# Patient Record
Sex: Female | Born: 1955 | Race: White | Hispanic: No
Health system: Southern US, Community
[De-identification: ages and names within clinical notes are randomized; demographics above are authoritative.]

---

## 2010-01-21 ENCOUNTER — Emergency Department (HOSPITAL_COMMUNITY): Admission: EM | Admit: 2010-01-21 | Discharge: 2010-01-22 | Payer: Self-pay | Admitting: Emergency Medicine

## 2010-05-31 LAB — ABO/RH: ABO/RH(D): A POS

## 2010-05-31 LAB — CBC
HCT: 37.1 % (ref 36.0–46.0)
Hemoglobin: 11.6 g/dL — ABNORMAL LOW (ref 12.0–15.0)
MCH: 27.3 pg (ref 26.0–34.0)
MCHC: 31.3 g/dL (ref 30.0–36.0)
MCV: 87.3 fL (ref 78.0–100.0)
Platelets: 305 10*3/uL (ref 150–400)
RBC: 4.25 MIL/uL (ref 3.87–5.11)
RDW: 14.7 % (ref 11.5–15.5)
WBC: 13.9 10*3/uL — ABNORMAL HIGH (ref 4.0–10.5)

## 2010-05-31 LAB — COMPREHENSIVE METABOLIC PANEL
Albumin: 3.3 g/dL — ABNORMAL LOW (ref 3.5–5.2)
Alkaline Phosphatase: 60 U/L (ref 39–117)
BUN: 6 mg/dL (ref 6–23)
Creatinine, Ser: 0.78 mg/dL (ref 0.4–1.2)
Glucose, Bld: 136 mg/dL — ABNORMAL HIGH (ref 70–99)
Potassium: 4 mEq/L (ref 3.5–5.1)
Total Protein: 6.6 g/dL (ref 6.0–8.3)

## 2010-05-31 LAB — ETHANOL: Alcohol, Ethyl (B): <5 mg/dL (ref 0–10)

## 2010-05-31 LAB — TYPE AND SCREEN: ABO/RH(D): A POS
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
## 2012-07-29 DIAGNOSIS — E538 Deficiency of other specified B group vitamins: Secondary | ICD-10-CM | POA: Diagnosis not present

## 2012-07-29 DIAGNOSIS — R1011 Right upper quadrant pain: Secondary | ICD-10-CM | POA: Diagnosis not present

## 2012-07-29 DIAGNOSIS — E7800 Pure hypercholesterolemia, unspecified: Secondary | ICD-10-CM | POA: Diagnosis not present

## 2012-07-30 DIAGNOSIS — E7800 Pure hypercholesterolemia, unspecified: Secondary | ICD-10-CM | POA: Diagnosis not present

## 2012-08-01 DIAGNOSIS — K8020 Calculus of gallbladder without cholecystitis without obstruction: Secondary | ICD-10-CM | POA: Diagnosis not present

## 2012-08-01 DIAGNOSIS — R1011 Right upper quadrant pain: Secondary | ICD-10-CM | POA: Diagnosis not present

## 2012-08-08 DIAGNOSIS — K8020 Calculus of gallbladder without cholecystitis without obstruction: Secondary | ICD-10-CM | POA: Diagnosis not present

## 2012-08-14 DIAGNOSIS — Z801 Family history of malignant neoplasm of trachea, bronchus and lung: Secondary | ICD-10-CM | POA: Diagnosis not present

## 2012-08-14 DIAGNOSIS — Z823 Family history of stroke: Secondary | ICD-10-CM | POA: Diagnosis not present

## 2012-08-14 DIAGNOSIS — Z880 Allergy status to penicillin: Secondary | ICD-10-CM | POA: Diagnosis not present

## 2012-08-14 DIAGNOSIS — Z79899 Other long term (current) drug therapy: Secondary | ICD-10-CM | POA: Diagnosis not present

## 2012-08-14 DIAGNOSIS — K8010 Calculus of gallbladder with chronic cholecystitis without obstruction: Secondary | ICD-10-CM | POA: Diagnosis not present

## 2012-08-14 DIAGNOSIS — Z8261 Family history of arthritis: Secondary | ICD-10-CM | POA: Diagnosis not present

## 2012-08-14 DIAGNOSIS — E538 Deficiency of other specified B group vitamins: Secondary | ICD-10-CM | POA: Diagnosis not present

## 2012-08-14 DIAGNOSIS — E7800 Pure hypercholesterolemia, unspecified: Secondary | ICD-10-CM | POA: Diagnosis not present

## 2012-08-14 DIAGNOSIS — Z8249 Family history of ischemic heart disease and other diseases of the circulatory system: Secondary | ICD-10-CM | POA: Diagnosis not present

## 2012-08-16 DIAGNOSIS — Z79899 Other long term (current) drug therapy: Secondary | ICD-10-CM | POA: Diagnosis not present

## 2012-08-16 DIAGNOSIS — Z823 Family history of stroke: Secondary | ICD-10-CM | POA: Diagnosis not present

## 2012-08-16 DIAGNOSIS — Z801 Family history of malignant neoplasm of trachea, bronchus and lung: Secondary | ICD-10-CM | POA: Diagnosis not present

## 2012-08-16 DIAGNOSIS — E538 Deficiency of other specified B group vitamins: Secondary | ICD-10-CM | POA: Diagnosis not present

## 2012-08-16 DIAGNOSIS — K8066 Calculus of gallbladder and bile duct with acute and chronic cholecystitis without obstruction: Secondary | ICD-10-CM | POA: Diagnosis not present

## 2012-08-16 DIAGNOSIS — Z8261 Family history of arthritis: Secondary | ICD-10-CM | POA: Diagnosis not present

## 2012-08-16 DIAGNOSIS — K8020 Calculus of gallbladder without cholecystitis without obstruction: Secondary | ICD-10-CM | POA: Diagnosis not present

## 2012-08-16 DIAGNOSIS — Z8249 Family history of ischemic heart disease and other diseases of the circulatory system: Secondary | ICD-10-CM | POA: Diagnosis not present

## 2012-08-16 DIAGNOSIS — K8010 Calculus of gallbladder with chronic cholecystitis without obstruction: Secondary | ICD-10-CM | POA: Diagnosis not present

## 2012-08-16 DIAGNOSIS — E7800 Pure hypercholesterolemia, unspecified: Secondary | ICD-10-CM | POA: Diagnosis not present

## 2012-08-29 DIAGNOSIS — E538 Deficiency of other specified B group vitamins: Secondary | ICD-10-CM | POA: Diagnosis not present

## 2012-10-07 DIAGNOSIS — E538 Deficiency of other specified B group vitamins: Secondary | ICD-10-CM | POA: Diagnosis not present

## 2012-11-14 DIAGNOSIS — E538 Deficiency of other specified B group vitamins: Secondary | ICD-10-CM | POA: Diagnosis not present

## 2012-12-17 DIAGNOSIS — E538 Deficiency of other specified B group vitamins: Secondary | ICD-10-CM | POA: Diagnosis not present

## 2013-01-14 DIAGNOSIS — E538 Deficiency of other specified B group vitamins: Secondary | ICD-10-CM | POA: Diagnosis not present

## 2013-01-27 DIAGNOSIS — F321 Major depressive disorder, single episode, moderate: Secondary | ICD-10-CM | POA: Diagnosis not present

## 2013-01-27 DIAGNOSIS — M67919 Unspecified disorder of synovium and tendon, unspecified shoulder: Secondary | ICD-10-CM | POA: Diagnosis not present

## 2013-01-27 DIAGNOSIS — F411 Generalized anxiety disorder: Secondary | ICD-10-CM | POA: Diagnosis not present

## 2013-01-28 DIAGNOSIS — E669 Obesity, unspecified: Secondary | ICD-10-CM | POA: Diagnosis not present

## 2013-01-28 DIAGNOSIS — E7800 Pure hypercholesterolemia, unspecified: Secondary | ICD-10-CM | POA: Diagnosis not present

## 2013-03-18 DIAGNOSIS — E538 Deficiency of other specified B group vitamins: Secondary | ICD-10-CM | POA: Diagnosis not present

## 2013-04-14 DIAGNOSIS — D510 Vitamin B12 deficiency anemia due to intrinsic factor deficiency: Secondary | ICD-10-CM | POA: Diagnosis not present

## 2013-04-14 DIAGNOSIS — M79609 Pain in unspecified limb: Secondary | ICD-10-CM | POA: Diagnosis not present

## 2013-04-14 DIAGNOSIS — E7800 Pure hypercholesterolemia, unspecified: Secondary | ICD-10-CM | POA: Diagnosis not present

## 2013-04-14 DIAGNOSIS — E538 Deficiency of other specified B group vitamins: Secondary | ICD-10-CM | POA: Diagnosis not present

## 2013-04-14 DIAGNOSIS — IMO0002 Reserved for concepts with insufficient information to code with codable children: Secondary | ICD-10-CM | POA: Diagnosis not present

## 2013-05-19 DIAGNOSIS — E538 Deficiency of other specified B group vitamins: Secondary | ICD-10-CM | POA: Diagnosis not present

## 2013-07-07 DIAGNOSIS — E538 Deficiency of other specified B group vitamins: Secondary | ICD-10-CM | POA: Diagnosis not present

## 2013-08-05 DIAGNOSIS — E538 Deficiency of other specified B group vitamins: Secondary | ICD-10-CM | POA: Diagnosis not present

## 2013-09-24 DIAGNOSIS — M1990 Unspecified osteoarthritis, unspecified site: Secondary | ICD-10-CM | POA: Diagnosis not present

## 2013-09-24 DIAGNOSIS — F411 Generalized anxiety disorder: Secondary | ICD-10-CM | POA: Diagnosis not present

## 2013-09-24 DIAGNOSIS — D510 Vitamin B12 deficiency anemia due to intrinsic factor deficiency: Secondary | ICD-10-CM | POA: Diagnosis not present

## 2013-09-24 DIAGNOSIS — E669 Obesity, unspecified: Secondary | ICD-10-CM | POA: Diagnosis not present

## 2013-09-24 DIAGNOSIS — E7800 Pure hypercholesterolemia, unspecified: Secondary | ICD-10-CM | POA: Diagnosis not present

## 2013-09-24 DIAGNOSIS — E538 Deficiency of other specified B group vitamins: Secondary | ICD-10-CM | POA: Diagnosis not present

## 2013-09-30 DIAGNOSIS — E7800 Pure hypercholesterolemia, unspecified: Secondary | ICD-10-CM | POA: Diagnosis not present

## 2013-09-30 DIAGNOSIS — M79609 Pain in unspecified limb: Secondary | ICD-10-CM | POA: Diagnosis not present

## 2013-09-30 DIAGNOSIS — E538 Deficiency of other specified B group vitamins: Secondary | ICD-10-CM | POA: Diagnosis not present

## 2013-09-30 DIAGNOSIS — D510 Vitamin B12 deficiency anemia due to intrinsic factor deficiency: Secondary | ICD-10-CM | POA: Diagnosis not present

## 2013-11-06 DIAGNOSIS — E538 Deficiency of other specified B group vitamins: Secondary | ICD-10-CM | POA: Diagnosis not present

## 2013-12-16 DIAGNOSIS — E538 Deficiency of other specified B group vitamins: Secondary | ICD-10-CM | POA: Diagnosis not present

## 2014-02-06 DIAGNOSIS — E539 Vitamin B deficiency, unspecified: Secondary | ICD-10-CM | POA: Diagnosis not present

## 2014-03-10 DIAGNOSIS — E539 Vitamin B deficiency, unspecified: Secondary | ICD-10-CM | POA: Diagnosis not present

## 2014-03-24 DIAGNOSIS — E780 Pure hypercholesterolemia: Secondary | ICD-10-CM | POA: Diagnosis not present

## 2014-03-24 DIAGNOSIS — E669 Obesity, unspecified: Secondary | ICD-10-CM | POA: Diagnosis not present

## 2014-03-24 DIAGNOSIS — E539 Vitamin B deficiency, unspecified: Secondary | ICD-10-CM | POA: Diagnosis not present

## 2014-03-24 DIAGNOSIS — D510 Vitamin B12 deficiency anemia due to intrinsic factor deficiency: Secondary | ICD-10-CM | POA: Diagnosis not present

## 2014-04-29 DIAGNOSIS — Z1389 Encounter for screening for other disorder: Secondary | ICD-10-CM | POA: Diagnosis not present

## 2014-04-29 DIAGNOSIS — E669 Obesity, unspecified: Secondary | ICD-10-CM | POA: Diagnosis not present

## 2014-04-29 DIAGNOSIS — S92001S Unspecified fracture of right calcaneus, sequela: Secondary | ICD-10-CM | POA: Diagnosis not present

## 2014-04-29 DIAGNOSIS — F411 Generalized anxiety disorder: Secondary | ICD-10-CM | POA: Diagnosis not present

## 2014-04-29 DIAGNOSIS — E782 Mixed hyperlipidemia: Secondary | ICD-10-CM | POA: Diagnosis not present

## 2014-04-29 DIAGNOSIS — E539 Vitamin B deficiency, unspecified: Secondary | ICD-10-CM | POA: Diagnosis not present

## 2014-04-29 DIAGNOSIS — F324 Major depressive disorder, single episode, in partial remission: Secondary | ICD-10-CM | POA: Diagnosis not present

## 2014-06-03 DIAGNOSIS — E539 Vitamin B deficiency, unspecified: Secondary | ICD-10-CM | POA: Diagnosis not present

## 2014-07-07 DIAGNOSIS — E539 Vitamin B deficiency, unspecified: Secondary | ICD-10-CM | POA: Diagnosis not present

## 2014-07-31 DIAGNOSIS — E539 Vitamin B deficiency, unspecified: Secondary | ICD-10-CM | POA: Diagnosis not present

## 2014-09-10 DIAGNOSIS — E539 Vitamin B deficiency, unspecified: Secondary | ICD-10-CM | POA: Diagnosis not present

## 2014-09-10 DIAGNOSIS — E782 Mixed hyperlipidemia: Secondary | ICD-10-CM | POA: Diagnosis not present

## 2014-09-10 DIAGNOSIS — D519 Vitamin B12 deficiency anemia, unspecified: Secondary | ICD-10-CM | POA: Diagnosis not present

## 2014-09-16 DIAGNOSIS — F324 Major depressive disorder, single episode, in partial remission: Secondary | ICD-10-CM | POA: Diagnosis not present

## 2014-09-16 DIAGNOSIS — F411 Generalized anxiety disorder: Secondary | ICD-10-CM | POA: Diagnosis not present

## 2014-09-16 DIAGNOSIS — E539 Vitamin B deficiency, unspecified: Secondary | ICD-10-CM | POA: Diagnosis not present

## 2014-09-16 DIAGNOSIS — E782 Mixed hyperlipidemia: Secondary | ICD-10-CM | POA: Diagnosis not present

## 2014-09-16 DIAGNOSIS — E669 Obesity, unspecified: Secondary | ICD-10-CM | POA: Diagnosis not present

## 2014-09-16 DIAGNOSIS — S92001S Unspecified fracture of right calcaneus, sequela: Secondary | ICD-10-CM | POA: Diagnosis not present

## 2014-10-14 DIAGNOSIS — D510 Vitamin B12 deficiency anemia due to intrinsic factor deficiency: Secondary | ICD-10-CM | POA: Diagnosis not present

## 2014-11-12 DIAGNOSIS — E539 Vitamin B deficiency, unspecified: Secondary | ICD-10-CM | POA: Diagnosis not present

## 2014-12-15 DIAGNOSIS — E539 Vitamin B deficiency, unspecified: Secondary | ICD-10-CM | POA: Diagnosis not present

## 2015-01-15 DIAGNOSIS — E539 Vitamin B deficiency, unspecified: Secondary | ICD-10-CM | POA: Diagnosis not present

## 2015-02-18 DIAGNOSIS — E539 Vitamin B deficiency, unspecified: Secondary | ICD-10-CM | POA: Diagnosis not present

## 2015-03-09 DIAGNOSIS — D510 Vitamin B12 deficiency anemia due to intrinsic factor deficiency: Secondary | ICD-10-CM | POA: Diagnosis not present

## 2015-03-09 DIAGNOSIS — E539 Vitamin B deficiency, unspecified: Secondary | ICD-10-CM | POA: Diagnosis not present

## 2015-03-09 DIAGNOSIS — Z13220 Encounter for screening for lipoid disorders: Secondary | ICD-10-CM | POA: Diagnosis not present

## 2015-03-09 DIAGNOSIS — E782 Mixed hyperlipidemia: Secondary | ICD-10-CM | POA: Diagnosis not present

## 2015-03-16 DIAGNOSIS — E539 Vitamin B deficiency, unspecified: Secondary | ICD-10-CM | POA: Diagnosis not present

## 2015-03-16 DIAGNOSIS — F411 Generalized anxiety disorder: Secondary | ICD-10-CM | POA: Diagnosis not present

## 2015-03-16 DIAGNOSIS — E669 Obesity, unspecified: Secondary | ICD-10-CM | POA: Diagnosis not present

## 2015-03-16 DIAGNOSIS — F324 Major depressive disorder, single episode, in partial remission: Secondary | ICD-10-CM | POA: Diagnosis not present

## 2015-03-16 DIAGNOSIS — Z23 Encounter for immunization: Secondary | ICD-10-CM | POA: Diagnosis not present

## 2015-03-16 DIAGNOSIS — S92001S Unspecified fracture of right calcaneus, sequela: Secondary | ICD-10-CM | POA: Diagnosis not present

## 2015-03-16 DIAGNOSIS — E782 Mixed hyperlipidemia: Secondary | ICD-10-CM | POA: Diagnosis not present

## 2015-04-27 DIAGNOSIS — D510 Vitamin B12 deficiency anemia due to intrinsic factor deficiency: Secondary | ICD-10-CM | POA: Diagnosis not present

## 2015-06-16 DIAGNOSIS — D510 Vitamin B12 deficiency anemia due to intrinsic factor deficiency: Secondary | ICD-10-CM | POA: Diagnosis not present

## 2015-08-02 DIAGNOSIS — E539 Vitamin B deficiency, unspecified: Secondary | ICD-10-CM | POA: Diagnosis not present

## 2015-09-16 DIAGNOSIS — E782 Mixed hyperlipidemia: Secondary | ICD-10-CM | POA: Diagnosis not present

## 2015-09-16 DIAGNOSIS — E539 Vitamin B deficiency, unspecified: Secondary | ICD-10-CM | POA: Diagnosis not present

## 2015-09-16 DIAGNOSIS — D510 Vitamin B12 deficiency anemia due to intrinsic factor deficiency: Secondary | ICD-10-CM | POA: Diagnosis not present

## 2015-10-18 DIAGNOSIS — E539 Vitamin B deficiency, unspecified: Secondary | ICD-10-CM | POA: Diagnosis not present

## 2015-10-21 DIAGNOSIS — F324 Major depressive disorder, single episode, in partial remission: Secondary | ICD-10-CM | POA: Diagnosis not present

## 2015-10-21 DIAGNOSIS — Z1389 Encounter for screening for other disorder: Secondary | ICD-10-CM | POA: Diagnosis not present

## 2015-10-21 DIAGNOSIS — S92001S Unspecified fracture of right calcaneus, sequela: Secondary | ICD-10-CM | POA: Diagnosis not present

## 2015-10-21 DIAGNOSIS — Z6841 Body Mass Index (BMI) 40.0 and over, adult: Secondary | ICD-10-CM | POA: Diagnosis not present

## 2015-10-21 DIAGNOSIS — E539 Vitamin B deficiency, unspecified: Secondary | ICD-10-CM | POA: Diagnosis not present

## 2015-10-21 DIAGNOSIS — E782 Mixed hyperlipidemia: Secondary | ICD-10-CM | POA: Diagnosis not present

## 2015-10-21 DIAGNOSIS — F411 Generalized anxiety disorder: Secondary | ICD-10-CM | POA: Diagnosis not present

## 2015-11-16 DIAGNOSIS — E539 Vitamin B deficiency, unspecified: Secondary | ICD-10-CM | POA: Diagnosis not present

## 2015-12-06 DIAGNOSIS — E539 Vitamin B deficiency, unspecified: Secondary | ICD-10-CM | POA: Diagnosis not present

## 2015-12-06 DIAGNOSIS — Z6841 Body Mass Index (BMI) 40.0 and over, adult: Secondary | ICD-10-CM | POA: Diagnosis not present

## 2015-12-06 DIAGNOSIS — L723 Sebaceous cyst: Secondary | ICD-10-CM | POA: Diagnosis not present

## 2015-12-06 DIAGNOSIS — Z23 Encounter for immunization: Secondary | ICD-10-CM | POA: Diagnosis not present

## 2016-01-18 DIAGNOSIS — E539 Vitamin B deficiency, unspecified: Secondary | ICD-10-CM | POA: Diagnosis not present

## 2016-02-16 DIAGNOSIS — D510 Vitamin B12 deficiency anemia due to intrinsic factor deficiency: Secondary | ICD-10-CM | POA: Diagnosis not present

## 2016-03-28 DIAGNOSIS — E539 Vitamin B deficiency, unspecified: Secondary | ICD-10-CM | POA: Diagnosis not present

## 2016-05-11 DIAGNOSIS — E539 Vitamin B deficiency, unspecified: Secondary | ICD-10-CM | POA: Diagnosis not present

## 2016-06-21 DIAGNOSIS — E539 Vitamin B deficiency, unspecified: Secondary | ICD-10-CM | POA: Diagnosis not present

## 2016-06-29 DIAGNOSIS — F324 Major depressive disorder, single episode, in partial remission: Secondary | ICD-10-CM | POA: Diagnosis not present

## 2016-06-29 DIAGNOSIS — E539 Vitamin B deficiency, unspecified: Secondary | ICD-10-CM | POA: Diagnosis not present

## 2016-06-29 DIAGNOSIS — D510 Vitamin B12 deficiency anemia due to intrinsic factor deficiency: Secondary | ICD-10-CM | POA: Diagnosis not present

## 2016-06-29 DIAGNOSIS — E7800 Pure hypercholesterolemia, unspecified: Secondary | ICD-10-CM | POA: Diagnosis not present

## 2016-06-29 DIAGNOSIS — E782 Mixed hyperlipidemia: Secondary | ICD-10-CM | POA: Diagnosis not present

## 2016-07-13 DIAGNOSIS — D510 Vitamin B12 deficiency anemia due to intrinsic factor deficiency: Secondary | ICD-10-CM | POA: Diagnosis not present

## 2016-07-13 DIAGNOSIS — E782 Mixed hyperlipidemia: Secondary | ICD-10-CM | POA: Diagnosis not present

## 2016-07-13 DIAGNOSIS — M79671 Pain in right foot: Secondary | ICD-10-CM | POA: Diagnosis not present

## 2016-07-13 DIAGNOSIS — Z6841 Body Mass Index (BMI) 40.0 and over, adult: Secondary | ICD-10-CM | POA: Diagnosis not present

## 2016-07-13 DIAGNOSIS — F411 Generalized anxiety disorder: Secondary | ICD-10-CM | POA: Diagnosis not present

## 2016-08-15 DIAGNOSIS — E539 Vitamin B deficiency, unspecified: Secondary | ICD-10-CM | POA: Diagnosis not present

## 2016-08-15 DIAGNOSIS — Z0001 Encounter for general adult medical examination with abnormal findings: Secondary | ICD-10-CM | POA: Diagnosis not present

## 2016-09-13 DIAGNOSIS — E539 Vitamin B deficiency, unspecified: Secondary | ICD-10-CM | POA: Diagnosis not present

## 2016-10-24 DIAGNOSIS — E539 Vitamin B deficiency, unspecified: Secondary | ICD-10-CM | POA: Diagnosis not present

## 2016-12-05 DIAGNOSIS — E539 Vitamin B deficiency, unspecified: Secondary | ICD-10-CM | POA: Diagnosis not present

## 2017-01-09 DIAGNOSIS — D510 Vitamin B12 deficiency anemia due to intrinsic factor deficiency: Secondary | ICD-10-CM | POA: Diagnosis not present

## 2017-01-09 DIAGNOSIS — F324 Major depressive disorder, single episode, in partial remission: Secondary | ICD-10-CM | POA: Diagnosis not present

## 2017-01-09 DIAGNOSIS — E7800 Pure hypercholesterolemia, unspecified: Secondary | ICD-10-CM | POA: Diagnosis not present

## 2017-01-09 DIAGNOSIS — E782 Mixed hyperlipidemia: Secondary | ICD-10-CM | POA: Diagnosis not present

## 2017-01-23 DIAGNOSIS — M79671 Pain in right foot: Secondary | ICD-10-CM | POA: Diagnosis not present

## 2017-01-23 DIAGNOSIS — D510 Vitamin B12 deficiency anemia due to intrinsic factor deficiency: Secondary | ICD-10-CM | POA: Diagnosis not present

## 2017-01-23 DIAGNOSIS — E782 Mixed hyperlipidemia: Secondary | ICD-10-CM | POA: Diagnosis not present

## 2017-01-23 DIAGNOSIS — F411 Generalized anxiety disorder: Secondary | ICD-10-CM | POA: Diagnosis not present

## 2017-01-23 DIAGNOSIS — Z6838 Body mass index (BMI) 38.0-38.9, adult: Secondary | ICD-10-CM | POA: Diagnosis not present

## 2017-01-23 DIAGNOSIS — Z23 Encounter for immunization: Secondary | ICD-10-CM | POA: Diagnosis not present

## 2017-02-20 DIAGNOSIS — D510 Vitamin B12 deficiency anemia due to intrinsic factor deficiency: Secondary | ICD-10-CM | POA: Diagnosis not present

## 2017-03-30 DIAGNOSIS — E539 Vitamin B deficiency, unspecified: Secondary | ICD-10-CM | POA: Diagnosis not present

## 2017-05-03 DIAGNOSIS — D510 Vitamin B12 deficiency anemia due to intrinsic factor deficiency: Secondary | ICD-10-CM | POA: Diagnosis not present

## 2017-05-07 DIAGNOSIS — Z6839 Body mass index (BMI) 39.0-39.9, adult: Secondary | ICD-10-CM | POA: Diagnosis not present

## 2017-05-07 DIAGNOSIS — K047 Periapical abscess without sinus: Secondary | ICD-10-CM | POA: Diagnosis not present

## 2017-06-04 DIAGNOSIS — D510 Vitamin B12 deficiency anemia due to intrinsic factor deficiency: Secondary | ICD-10-CM | POA: Diagnosis not present

## 2017-07-02 DIAGNOSIS — D510 Vitamin B12 deficiency anemia due to intrinsic factor deficiency: Secondary | ICD-10-CM | POA: Diagnosis not present

## 2017-07-26 DIAGNOSIS — D510 Vitamin B12 deficiency anemia due to intrinsic factor deficiency: Secondary | ICD-10-CM | POA: Diagnosis not present

## 2017-07-30 DIAGNOSIS — E782 Mixed hyperlipidemia: Secondary | ICD-10-CM | POA: Diagnosis not present

## 2017-07-30 DIAGNOSIS — D510 Vitamin B12 deficiency anemia due to intrinsic factor deficiency: Secondary | ICD-10-CM | POA: Diagnosis not present

## 2017-07-30 DIAGNOSIS — R5383 Other fatigue: Secondary | ICD-10-CM | POA: Diagnosis not present

## 2017-07-30 DIAGNOSIS — F411 Generalized anxiety disorder: Secondary | ICD-10-CM | POA: Diagnosis not present

## 2017-07-30 DIAGNOSIS — E669 Obesity, unspecified: Secondary | ICD-10-CM | POA: Diagnosis not present

## 2017-07-30 DIAGNOSIS — E7800 Pure hypercholesterolemia, unspecified: Secondary | ICD-10-CM | POA: Diagnosis not present

## 2017-07-30 DIAGNOSIS — E539 Vitamin B deficiency, unspecified: Secondary | ICD-10-CM | POA: Diagnosis not present

## 2017-07-30 DIAGNOSIS — R946 Abnormal results of thyroid function studies: Secondary | ICD-10-CM | POA: Diagnosis not present

## 2017-08-02 DIAGNOSIS — E782 Mixed hyperlipidemia: Secondary | ICD-10-CM | POA: Diagnosis not present

## 2017-08-02 DIAGNOSIS — M79671 Pain in right foot: Secondary | ICD-10-CM | POA: Diagnosis not present

## 2017-08-02 DIAGNOSIS — R5383 Other fatigue: Secondary | ICD-10-CM | POA: Diagnosis not present

## 2017-08-02 DIAGNOSIS — D510 Vitamin B12 deficiency anemia due to intrinsic factor deficiency: Secondary | ICD-10-CM | POA: Diagnosis not present

## 2017-08-02 DIAGNOSIS — Z6839 Body mass index (BMI) 39.0-39.9, adult: Secondary | ICD-10-CM | POA: Diagnosis not present

## 2017-08-02 DIAGNOSIS — F411 Generalized anxiety disorder: Secondary | ICD-10-CM | POA: Diagnosis not present

## 2017-08-02 DIAGNOSIS — R7301 Impaired fasting glucose: Secondary | ICD-10-CM | POA: Diagnosis not present

## 2017-08-28 DIAGNOSIS — E539 Vitamin B deficiency, unspecified: Secondary | ICD-10-CM | POA: Diagnosis not present

## 2017-09-21 DIAGNOSIS — D510 Vitamin B12 deficiency anemia due to intrinsic factor deficiency: Secondary | ICD-10-CM | POA: Diagnosis not present

## 2017-11-06 DIAGNOSIS — D510 Vitamin B12 deficiency anemia due to intrinsic factor deficiency: Secondary | ICD-10-CM | POA: Diagnosis not present

## 2017-12-10 DIAGNOSIS — D510 Vitamin B12 deficiency anemia due to intrinsic factor deficiency: Secondary | ICD-10-CM | POA: Diagnosis not present

## 2018-01-09 DIAGNOSIS — D510 Vitamin B12 deficiency anemia due to intrinsic factor deficiency: Secondary | ICD-10-CM | POA: Diagnosis not present

## 2018-01-28 DIAGNOSIS — R7301 Impaired fasting glucose: Secondary | ICD-10-CM | POA: Diagnosis not present

## 2018-01-28 DIAGNOSIS — E7800 Pure hypercholesterolemia, unspecified: Secondary | ICD-10-CM | POA: Diagnosis not present

## 2018-01-28 DIAGNOSIS — D510 Vitamin B12 deficiency anemia due to intrinsic factor deficiency: Secondary | ICD-10-CM | POA: Diagnosis not present

## 2018-01-28 DIAGNOSIS — F324 Major depressive disorder, single episode, in partial remission: Secondary | ICD-10-CM | POA: Diagnosis not present

## 2018-01-28 DIAGNOSIS — E782 Mixed hyperlipidemia: Secondary | ICD-10-CM | POA: Diagnosis not present

## 2018-01-28 DIAGNOSIS — F411 Generalized anxiety disorder: Secondary | ICD-10-CM | POA: Diagnosis not present

## 2018-01-30 DIAGNOSIS — Z0001 Encounter for general adult medical examination with abnormal findings: Secondary | ICD-10-CM | POA: Diagnosis not present

## 2018-01-30 DIAGNOSIS — Z23 Encounter for immunization: Secondary | ICD-10-CM | POA: Diagnosis not present

## 2018-01-30 DIAGNOSIS — Z6841 Body Mass Index (BMI) 40.0 and over, adult: Secondary | ICD-10-CM | POA: Diagnosis not present

## 2018-01-30 DIAGNOSIS — Z1212 Encounter for screening for malignant neoplasm of rectum: Secondary | ICD-10-CM | POA: Diagnosis not present

## 2018-03-18 DIAGNOSIS — E539 Vitamin B deficiency, unspecified: Secondary | ICD-10-CM | POA: Diagnosis not present

## 2018-03-27 ENCOUNTER — Encounter: Payer: Self-pay | Admitting: Gastroenterology

## 2018-04-17 DIAGNOSIS — D510 Vitamin B12 deficiency anemia due to intrinsic factor deficiency: Secondary | ICD-10-CM | POA: Diagnosis not present

## 2018-05-17 DIAGNOSIS — R5383 Other fatigue: Secondary | ICD-10-CM | POA: Diagnosis not present

## 2018-05-17 DIAGNOSIS — R946 Abnormal results of thyroid function studies: Secondary | ICD-10-CM | POA: Diagnosis not present

## 2018-05-17 DIAGNOSIS — E539 Vitamin B deficiency, unspecified: Secondary | ICD-10-CM | POA: Diagnosis not present

## 2018-05-17 DIAGNOSIS — R7301 Impaired fasting glucose: Secondary | ICD-10-CM | POA: Diagnosis not present

## 2018-05-17 DIAGNOSIS — E7800 Pure hypercholesterolemia, unspecified: Secondary | ICD-10-CM | POA: Diagnosis not present

## 2018-05-17 DIAGNOSIS — E782 Mixed hyperlipidemia: Secondary | ICD-10-CM | POA: Diagnosis not present

## 2018-05-17 DIAGNOSIS — D510 Vitamin B12 deficiency anemia due to intrinsic factor deficiency: Secondary | ICD-10-CM | POA: Diagnosis not present

## 2018-05-21 DIAGNOSIS — M79671 Pain in right foot: Secondary | ICD-10-CM | POA: Diagnosis not present

## 2018-05-21 DIAGNOSIS — F411 Generalized anxiety disorder: Secondary | ICD-10-CM | POA: Diagnosis not present

## 2018-05-21 DIAGNOSIS — E782 Mixed hyperlipidemia: Secondary | ICD-10-CM | POA: Diagnosis not present

## 2018-05-21 DIAGNOSIS — R5383 Other fatigue: Secondary | ICD-10-CM | POA: Diagnosis not present

## 2018-05-21 DIAGNOSIS — Z6841 Body Mass Index (BMI) 40.0 and over, adult: Secondary | ICD-10-CM | POA: Diagnosis not present

## 2018-05-21 DIAGNOSIS — R7301 Impaired fasting glucose: Secondary | ICD-10-CM | POA: Diagnosis not present

## 2018-05-21 DIAGNOSIS — D510 Vitamin B12 deficiency anemia due to intrinsic factor deficiency: Secondary | ICD-10-CM | POA: Diagnosis not present

## 2018-06-03 ENCOUNTER — Telehealth: Payer: Self-pay | Admitting: Gastroenterology

## 2018-06-03 ENCOUNTER — Ambulatory Visit: Payer: Self-pay | Admitting: Nurse Practitioner

## 2018-06-03 ENCOUNTER — Encounter: Payer: Self-pay | Admitting: Gastroenterology

## 2018-06-03 NOTE — Telephone Encounter (Signed)
PATIENT WAS A NO SHOW AND LETTER SENT  °

## 2018-08-14 DIAGNOSIS — F329 Major depressive disorder, single episode, unspecified: Secondary | ICD-10-CM | POA: Diagnosis not present

## 2018-08-14 DIAGNOSIS — E782 Mixed hyperlipidemia: Secondary | ICD-10-CM | POA: Diagnosis not present

## 2018-08-14 DIAGNOSIS — F411 Generalized anxiety disorder: Secondary | ICD-10-CM | POA: Diagnosis not present

## 2018-08-14 DIAGNOSIS — M79671 Pain in right foot: Secondary | ICD-10-CM | POA: Diagnosis not present

## 2018-08-14 DIAGNOSIS — R7301 Impaired fasting glucose: Secondary | ICD-10-CM | POA: Diagnosis not present

## 2018-08-14 DIAGNOSIS — R5383 Other fatigue: Secondary | ICD-10-CM | POA: Diagnosis not present

## 2018-08-14 DIAGNOSIS — D510 Vitamin B12 deficiency anemia due to intrinsic factor deficiency: Secondary | ICD-10-CM | POA: Diagnosis not present

## 2018-08-16 DIAGNOSIS — E539 Vitamin B deficiency, unspecified: Secondary | ICD-10-CM | POA: Diagnosis not present

## 2018-10-14 DIAGNOSIS — D510 Vitamin B12 deficiency anemia due to intrinsic factor deficiency: Secondary | ICD-10-CM | POA: Diagnosis not present

## 2018-12-02 DIAGNOSIS — D510 Vitamin B12 deficiency anemia due to intrinsic factor deficiency: Secondary | ICD-10-CM | POA: Diagnosis not present

## 2019-01-09 DIAGNOSIS — E539 Vitamin B deficiency, unspecified: Secondary | ICD-10-CM | POA: Diagnosis not present

## 2019-02-07 DIAGNOSIS — F411 Generalized anxiety disorder: Secondary | ICD-10-CM | POA: Diagnosis not present

## 2019-02-07 DIAGNOSIS — R7301 Impaired fasting glucose: Secondary | ICD-10-CM | POA: Diagnosis not present

## 2019-02-07 DIAGNOSIS — E782 Mixed hyperlipidemia: Secondary | ICD-10-CM | POA: Diagnosis not present

## 2019-02-07 DIAGNOSIS — D510 Vitamin B12 deficiency anemia due to intrinsic factor deficiency: Secondary | ICD-10-CM | POA: Diagnosis not present

## 2019-02-07 DIAGNOSIS — E7800 Pure hypercholesterolemia, unspecified: Secondary | ICD-10-CM | POA: Diagnosis not present

## 2019-02-10 DIAGNOSIS — D510 Vitamin B12 deficiency anemia due to intrinsic factor deficiency: Secondary | ICD-10-CM | POA: Diagnosis not present

## 2019-02-18 DIAGNOSIS — Z20828 Contact with and (suspected) exposure to other viral communicable diseases: Secondary | ICD-10-CM | POA: Diagnosis not present

## 2019-02-24 DIAGNOSIS — U071 COVID-19: Secondary | ICD-10-CM | POA: Diagnosis not present

## 2019-02-24 DIAGNOSIS — D510 Vitamin B12 deficiency anemia due to intrinsic factor deficiency: Secondary | ICD-10-CM | POA: Diagnosis not present

## 2019-02-24 DIAGNOSIS — R7301 Impaired fasting glucose: Secondary | ICD-10-CM | POA: Diagnosis not present

## 2019-02-24 DIAGNOSIS — F411 Generalized anxiety disorder: Secondary | ICD-10-CM | POA: Diagnosis not present

## 2019-02-24 DIAGNOSIS — E782 Mixed hyperlipidemia: Secondary | ICD-10-CM | POA: Diagnosis not present

## 2019-02-24 DIAGNOSIS — M79671 Pain in right foot: Secondary | ICD-10-CM | POA: Diagnosis not present

## 2019-02-24 DIAGNOSIS — R5383 Other fatigue: Secondary | ICD-10-CM | POA: Diagnosis not present

## 2019-02-24 DIAGNOSIS — F329 Major depressive disorder, single episode, unspecified: Secondary | ICD-10-CM | POA: Diagnosis not present

## 2019-04-14 DIAGNOSIS — E538 Deficiency of other specified B group vitamins: Secondary | ICD-10-CM | POA: Diagnosis not present

## 2019-06-03 DIAGNOSIS — E538 Deficiency of other specified B group vitamins: Secondary | ICD-10-CM | POA: Diagnosis not present

## 2019-07-08 DIAGNOSIS — E538 Deficiency of other specified B group vitamins: Secondary | ICD-10-CM | POA: Diagnosis not present

## 2019-07-10 DIAGNOSIS — Z23 Encounter for immunization: Secondary | ICD-10-CM | POA: Diagnosis not present

## 2019-08-04 DIAGNOSIS — L255 Unspecified contact dermatitis due to plants, except food: Secondary | ICD-10-CM | POA: Diagnosis not present

## 2019-08-27 DIAGNOSIS — E7800 Pure hypercholesterolemia, unspecified: Secondary | ICD-10-CM | POA: Diagnosis not present

## 2019-08-27 DIAGNOSIS — E782 Mixed hyperlipidemia: Secondary | ICD-10-CM | POA: Diagnosis not present

## 2019-08-27 DIAGNOSIS — R7301 Impaired fasting glucose: Secondary | ICD-10-CM | POA: Diagnosis not present

## 2019-08-27 DIAGNOSIS — R5383 Other fatigue: Secondary | ICD-10-CM | POA: Diagnosis not present

## 2019-08-27 DIAGNOSIS — D510 Vitamin B12 deficiency anemia due to intrinsic factor deficiency: Secondary | ICD-10-CM | POA: Diagnosis not present

## 2019-10-14 DIAGNOSIS — D510 Vitamin B12 deficiency anemia due to intrinsic factor deficiency: Secondary | ICD-10-CM | POA: Diagnosis not present

## 2019-12-03 DIAGNOSIS — E538 Deficiency of other specified B group vitamins: Secondary | ICD-10-CM | POA: Diagnosis not present

## 2019-12-16 DIAGNOSIS — Z23 Encounter for immunization: Secondary | ICD-10-CM | POA: Diagnosis not present

## 2019-12-31 DIAGNOSIS — E538 Deficiency of other specified B group vitamins: Secondary | ICD-10-CM | POA: Diagnosis not present

## 2019-12-31 DIAGNOSIS — R5383 Other fatigue: Secondary | ICD-10-CM | POA: Diagnosis not present

## 2019-12-31 DIAGNOSIS — R7301 Impaired fasting glucose: Secondary | ICD-10-CM | POA: Diagnosis not present

## 2019-12-31 DIAGNOSIS — E782 Mixed hyperlipidemia: Secondary | ICD-10-CM | POA: Diagnosis not present

## 2020-01-07 DIAGNOSIS — Z6841 Body Mass Index (BMI) 40.0 and over, adult: Secondary | ICD-10-CM | POA: Diagnosis not present

## 2020-01-07 DIAGNOSIS — D510 Vitamin B12 deficiency anemia due to intrinsic factor deficiency: Secondary | ICD-10-CM | POA: Diagnosis not present

## 2020-01-07 DIAGNOSIS — F329 Major depressive disorder, single episode, unspecified: Secondary | ICD-10-CM | POA: Diagnosis not present

## 2020-01-07 DIAGNOSIS — M79671 Pain in right foot: Secondary | ICD-10-CM | POA: Diagnosis not present

## 2020-01-07 DIAGNOSIS — F411 Generalized anxiety disorder: Secondary | ICD-10-CM | POA: Diagnosis not present

## 2020-01-07 DIAGNOSIS — R7301 Impaired fasting glucose: Secondary | ICD-10-CM | POA: Diagnosis not present

## 2020-01-07 DIAGNOSIS — Z23 Encounter for immunization: Secondary | ICD-10-CM | POA: Diagnosis not present

## 2020-01-07 DIAGNOSIS — E782 Mixed hyperlipidemia: Secondary | ICD-10-CM | POA: Diagnosis not present

## 2020-03-25 DIAGNOSIS — E538 Deficiency of other specified B group vitamins: Secondary | ICD-10-CM | POA: Diagnosis not present

## 2020-03-25 DIAGNOSIS — Z23 Encounter for immunization: Secondary | ICD-10-CM | POA: Diagnosis not present

## 2020-06-14 DIAGNOSIS — E7801 Familial hypercholesterolemia: Secondary | ICD-10-CM | POA: Diagnosis not present

## 2020-06-14 DIAGNOSIS — E782 Mixed hyperlipidemia: Secondary | ICD-10-CM | POA: Diagnosis not present

## 2020-06-14 DIAGNOSIS — R5383 Other fatigue: Secondary | ICD-10-CM | POA: Diagnosis not present

## 2020-06-14 DIAGNOSIS — E539 Vitamin B deficiency, unspecified: Secondary | ICD-10-CM | POA: Diagnosis not present

## 2020-06-14 DIAGNOSIS — E7849 Other hyperlipidemia: Secondary | ICD-10-CM | POA: Diagnosis not present

## 2020-06-14 DIAGNOSIS — E7800 Pure hypercholesterolemia, unspecified: Secondary | ICD-10-CM | POA: Diagnosis not present

## 2020-06-17 DIAGNOSIS — M79671 Pain in right foot: Secondary | ICD-10-CM | POA: Diagnosis not present

## 2020-06-17 DIAGNOSIS — D510 Vitamin B12 deficiency anemia due to intrinsic factor deficiency: Secondary | ICD-10-CM | POA: Diagnosis not present

## 2020-06-17 DIAGNOSIS — F329 Major depressive disorder, single episode, unspecified: Secondary | ICD-10-CM | POA: Diagnosis not present

## 2020-06-17 DIAGNOSIS — Z6839 Body mass index (BMI) 39.0-39.9, adult: Secondary | ICD-10-CM | POA: Diagnosis not present

## 2020-06-17 DIAGNOSIS — F411 Generalized anxiety disorder: Secondary | ICD-10-CM | POA: Diagnosis not present

## 2020-06-17 DIAGNOSIS — E7849 Other hyperlipidemia: Secondary | ICD-10-CM | POA: Diagnosis not present

## 2020-06-17 DIAGNOSIS — R7301 Impaired fasting glucose: Secondary | ICD-10-CM | POA: Diagnosis not present

## 2020-11-05 DIAGNOSIS — E538 Deficiency of other specified B group vitamins: Secondary | ICD-10-CM | POA: Diagnosis not present

## 2021-02-17 DIAGNOSIS — R5383 Other fatigue: Secondary | ICD-10-CM | POA: Diagnosis not present

## 2021-02-17 DIAGNOSIS — E7800 Pure hypercholesterolemia, unspecified: Secondary | ICD-10-CM | POA: Diagnosis not present

## 2021-02-17 DIAGNOSIS — E538 Deficiency of other specified B group vitamins: Secondary | ICD-10-CM | POA: Diagnosis not present

## 2021-02-17 DIAGNOSIS — E7801 Familial hypercholesterolemia: Secondary | ICD-10-CM | POA: Diagnosis not present

## 2021-02-17 DIAGNOSIS — D510 Vitamin B12 deficiency anemia due to intrinsic factor deficiency: Secondary | ICD-10-CM | POA: Diagnosis not present

## 2021-02-17 DIAGNOSIS — E7849 Other hyperlipidemia: Secondary | ICD-10-CM | POA: Diagnosis not present

## 2021-02-17 DIAGNOSIS — E782 Mixed hyperlipidemia: Secondary | ICD-10-CM | POA: Diagnosis not present

## 2021-02-22 DIAGNOSIS — F411 Generalized anxiety disorder: Secondary | ICD-10-CM | POA: Diagnosis not present

## 2021-02-22 DIAGNOSIS — R7301 Impaired fasting glucose: Secondary | ICD-10-CM | POA: Diagnosis not present

## 2021-02-22 DIAGNOSIS — D510 Vitamin B12 deficiency anemia due to intrinsic factor deficiency: Secondary | ICD-10-CM | POA: Diagnosis not present

## 2021-02-22 DIAGNOSIS — M79671 Pain in right foot: Secondary | ICD-10-CM | POA: Diagnosis not present

## 2021-02-22 DIAGNOSIS — F329 Major depressive disorder, single episode, unspecified: Secondary | ICD-10-CM | POA: Diagnosis not present

## 2021-02-22 DIAGNOSIS — E538 Deficiency of other specified B group vitamins: Secondary | ICD-10-CM | POA: Diagnosis not present

## 2021-02-22 DIAGNOSIS — Z23 Encounter for immunization: Secondary | ICD-10-CM | POA: Diagnosis not present

## 2021-02-22 DIAGNOSIS — E7849 Other hyperlipidemia: Secondary | ICD-10-CM | POA: Diagnosis not present

## 2021-07-18 DIAGNOSIS — M1712 Unilateral primary osteoarthritis, left knee: Secondary | ICD-10-CM | POA: Diagnosis not present

## 2021-07-18 DIAGNOSIS — E538 Deficiency of other specified B group vitamins: Secondary | ICD-10-CM | POA: Diagnosis not present

## 2021-07-18 DIAGNOSIS — Z6838 Body mass index (BMI) 38.0-38.9, adult: Secondary | ICD-10-CM | POA: Diagnosis not present

## 2021-07-18 DIAGNOSIS — I10 Essential (primary) hypertension: Secondary | ICD-10-CM | POA: Diagnosis not present

## 2021-11-08 DIAGNOSIS — R7301 Impaired fasting glucose: Secondary | ICD-10-CM | POA: Diagnosis not present

## 2021-11-08 DIAGNOSIS — E7801 Familial hypercholesterolemia: Secondary | ICD-10-CM | POA: Diagnosis not present

## 2021-11-08 DIAGNOSIS — E7849 Other hyperlipidemia: Secondary | ICD-10-CM | POA: Diagnosis not present

## 2021-11-08 DIAGNOSIS — D510 Vitamin B12 deficiency anemia due to intrinsic factor deficiency: Secondary | ICD-10-CM | POA: Diagnosis not present

## 2021-11-08 DIAGNOSIS — E538 Deficiency of other specified B group vitamins: Secondary | ICD-10-CM | POA: Diagnosis not present

## 2021-11-15 DIAGNOSIS — D510 Vitamin B12 deficiency anemia due to intrinsic factor deficiency: Secondary | ICD-10-CM | POA: Diagnosis not present

## 2021-11-15 DIAGNOSIS — F329 Major depressive disorder, single episode, unspecified: Secondary | ICD-10-CM | POA: Diagnosis not present

## 2021-11-15 DIAGNOSIS — F411 Generalized anxiety disorder: Secondary | ICD-10-CM | POA: Diagnosis not present

## 2021-11-15 DIAGNOSIS — E538 Deficiency of other specified B group vitamins: Secondary | ICD-10-CM | POA: Diagnosis not present

## 2021-11-15 DIAGNOSIS — Z6839 Body mass index (BMI) 39.0-39.9, adult: Secondary | ICD-10-CM | POA: Diagnosis not present

## 2021-11-15 DIAGNOSIS — Z0001 Encounter for general adult medical examination with abnormal findings: Secondary | ICD-10-CM | POA: Diagnosis not present

## 2021-11-15 DIAGNOSIS — M79671 Pain in right foot: Secondary | ICD-10-CM | POA: Diagnosis not present

## 2021-11-15 DIAGNOSIS — E7849 Other hyperlipidemia: Secondary | ICD-10-CM | POA: Diagnosis not present

## 2021-11-15 DIAGNOSIS — R7301 Impaired fasting glucose: Secondary | ICD-10-CM | POA: Diagnosis not present

## 2021-11-28 DIAGNOSIS — E538 Deficiency of other specified B group vitamins: Secondary | ICD-10-CM | POA: Diagnosis not present

## 2022-01-31 DIAGNOSIS — E538 Deficiency of other specified B group vitamins: Secondary | ICD-10-CM | POA: Diagnosis not present

## 2022-01-31 DIAGNOSIS — R030 Elevated blood-pressure reading, without diagnosis of hypertension: Secondary | ICD-10-CM | POA: Diagnosis not present

## 2022-03-30 DIAGNOSIS — E538 Deficiency of other specified B group vitamins: Secondary | ICD-10-CM | POA: Diagnosis not present

## 2022-03-30 DIAGNOSIS — R030 Elevated blood-pressure reading, without diagnosis of hypertension: Secondary | ICD-10-CM | POA: Diagnosis not present

## 2022-04-25 DIAGNOSIS — R5383 Other fatigue: Secondary | ICD-10-CM | POA: Diagnosis not present

## 2022-04-25 DIAGNOSIS — D510 Vitamin B12 deficiency anemia due to intrinsic factor deficiency: Secondary | ICD-10-CM | POA: Diagnosis not present

## 2022-04-25 DIAGNOSIS — E7849 Other hyperlipidemia: Secondary | ICD-10-CM | POA: Diagnosis not present

## 2022-04-25 DIAGNOSIS — E7801 Familial hypercholesterolemia: Secondary | ICD-10-CM | POA: Diagnosis not present

## 2022-04-25 DIAGNOSIS — R7301 Impaired fasting glucose: Secondary | ICD-10-CM | POA: Diagnosis not present

## 2022-05-11 DIAGNOSIS — F411 Generalized anxiety disorder: Secondary | ICD-10-CM | POA: Diagnosis not present

## 2022-05-11 DIAGNOSIS — D510 Vitamin B12 deficiency anemia due to intrinsic factor deficiency: Secondary | ICD-10-CM | POA: Diagnosis not present

## 2022-05-11 DIAGNOSIS — E7849 Other hyperlipidemia: Secondary | ICD-10-CM | POA: Diagnosis not present

## 2022-05-11 DIAGNOSIS — Z6841 Body Mass Index (BMI) 40.0 and over, adult: Secondary | ICD-10-CM | POA: Diagnosis not present

## 2022-05-11 DIAGNOSIS — E538 Deficiency of other specified B group vitamins: Secondary | ICD-10-CM | POA: Diagnosis not present

## 2022-05-11 DIAGNOSIS — M1712 Unilateral primary osteoarthritis, left knee: Secondary | ICD-10-CM | POA: Diagnosis not present

## 2022-05-11 DIAGNOSIS — R030 Elevated blood-pressure reading, without diagnosis of hypertension: Secondary | ICD-10-CM | POA: Diagnosis not present

## 2022-05-11 DIAGNOSIS — F329 Major depressive disorder, single episode, unspecified: Secondary | ICD-10-CM | POA: Diagnosis not present

## 2022-05-11 DIAGNOSIS — Z23 Encounter for immunization: Secondary | ICD-10-CM | POA: Diagnosis not present

## 2022-05-11 DIAGNOSIS — M79671 Pain in right foot: Secondary | ICD-10-CM | POA: Diagnosis not present

## 2022-05-11 DIAGNOSIS — R7301 Impaired fasting glucose: Secondary | ICD-10-CM | POA: Diagnosis not present

## 2022-07-14 DIAGNOSIS — R030 Elevated blood-pressure reading, without diagnosis of hypertension: Secondary | ICD-10-CM | POA: Diagnosis not present

## 2022-07-14 DIAGNOSIS — E538 Deficiency of other specified B group vitamins: Secondary | ICD-10-CM | POA: Diagnosis not present

## 2022-08-03 DIAGNOSIS — R030 Elevated blood-pressure reading, without diagnosis of hypertension: Secondary | ICD-10-CM | POA: Diagnosis not present

## 2022-08-03 DIAGNOSIS — M1712 Unilateral primary osteoarthritis, left knee: Secondary | ICD-10-CM | POA: Diagnosis not present

## 2022-08-03 DIAGNOSIS — Z6841 Body Mass Index (BMI) 40.0 and over, adult: Secondary | ICD-10-CM | POA: Diagnosis not present

## 2022-08-07 DIAGNOSIS — R5383 Other fatigue: Secondary | ICD-10-CM | POA: Diagnosis not present

## 2022-08-07 DIAGNOSIS — E7849 Other hyperlipidemia: Secondary | ICD-10-CM | POA: Diagnosis not present

## 2022-08-07 DIAGNOSIS — R7301 Impaired fasting glucose: Secondary | ICD-10-CM | POA: Diagnosis not present

## 2022-08-07 DIAGNOSIS — D510 Vitamin B12 deficiency anemia due to intrinsic factor deficiency: Secondary | ICD-10-CM | POA: Diagnosis not present

## 2022-08-10 DIAGNOSIS — E538 Deficiency of other specified B group vitamins: Secondary | ICD-10-CM | POA: Diagnosis not present

## 2022-08-10 DIAGNOSIS — F411 Generalized anxiety disorder: Secondary | ICD-10-CM | POA: Diagnosis not present

## 2022-08-10 DIAGNOSIS — R7301 Impaired fasting glucose: Secondary | ICD-10-CM | POA: Diagnosis not present

## 2022-08-10 DIAGNOSIS — R030 Elevated blood-pressure reading, without diagnosis of hypertension: Secondary | ICD-10-CM | POA: Diagnosis not present

## 2022-08-10 DIAGNOSIS — Z6841 Body Mass Index (BMI) 40.0 and over, adult: Secondary | ICD-10-CM | POA: Diagnosis not present

## 2022-08-10 DIAGNOSIS — E7849 Other hyperlipidemia: Secondary | ICD-10-CM | POA: Diagnosis not present

## 2022-08-10 DIAGNOSIS — D510 Vitamin B12 deficiency anemia due to intrinsic factor deficiency: Secondary | ICD-10-CM | POA: Diagnosis not present

## 2022-08-10 DIAGNOSIS — M79671 Pain in right foot: Secondary | ICD-10-CM | POA: Diagnosis not present

## 2022-08-10 DIAGNOSIS — F329 Major depressive disorder, single episode, unspecified: Secondary | ICD-10-CM | POA: Diagnosis not present

## 2022-08-10 DIAGNOSIS — M1712 Unilateral primary osteoarthritis, left knee: Secondary | ICD-10-CM | POA: Diagnosis not present

## 2022-09-28 DIAGNOSIS — M25562 Pain in left knee: Secondary | ICD-10-CM | POA: Diagnosis not present

## 2022-09-28 DIAGNOSIS — M1712 Unilateral primary osteoarthritis, left knee: Secondary | ICD-10-CM | POA: Diagnosis not present

## 2022-09-28 DIAGNOSIS — Z6841 Body Mass Index (BMI) 40.0 and over, adult: Secondary | ICD-10-CM | POA: Diagnosis not present

## 2023-02-19 DIAGNOSIS — E7849 Other hyperlipidemia: Secondary | ICD-10-CM | POA: Diagnosis not present

## 2023-02-19 DIAGNOSIS — R7301 Impaired fasting glucose: Secondary | ICD-10-CM | POA: Diagnosis not present

## 2023-02-19 DIAGNOSIS — D510 Vitamin B12 deficiency anemia due to intrinsic factor deficiency: Secondary | ICD-10-CM | POA: Diagnosis not present

## 2023-02-19 DIAGNOSIS — E7801 Familial hypercholesterolemia: Secondary | ICD-10-CM | POA: Diagnosis not present

## 2023-02-22 DIAGNOSIS — E538 Deficiency of other specified B group vitamins: Secondary | ICD-10-CM | POA: Diagnosis not present

## 2023-02-22 DIAGNOSIS — R030 Elevated blood-pressure reading, without diagnosis of hypertension: Secondary | ICD-10-CM | POA: Diagnosis not present

## 2023-02-22 DIAGNOSIS — Z23 Encounter for immunization: Secondary | ICD-10-CM | POA: Diagnosis not present

## 2023-02-22 DIAGNOSIS — Z0001 Encounter for general adult medical examination with abnormal findings: Secondary | ICD-10-CM | POA: Diagnosis not present

## 2023-02-22 DIAGNOSIS — M1712 Unilateral primary osteoarthritis, left knee: Secondary | ICD-10-CM | POA: Diagnosis not present

## 2023-02-22 DIAGNOSIS — F411 Generalized anxiety disorder: Secondary | ICD-10-CM | POA: Diagnosis not present

## 2023-02-22 DIAGNOSIS — M79671 Pain in right foot: Secondary | ICD-10-CM | POA: Diagnosis not present

## 2023-02-22 DIAGNOSIS — F329 Major depressive disorder, single episode, unspecified: Secondary | ICD-10-CM | POA: Diagnosis not present

## 2023-02-22 DIAGNOSIS — D510 Vitamin B12 deficiency anemia due to intrinsic factor deficiency: Secondary | ICD-10-CM | POA: Diagnosis not present

## 2023-02-22 DIAGNOSIS — R7301 Impaired fasting glucose: Secondary | ICD-10-CM | POA: Diagnosis not present

## 2023-02-22 DIAGNOSIS — E7849 Other hyperlipidemia: Secondary | ICD-10-CM | POA: Diagnosis not present

## 2023-08-14 DIAGNOSIS — R5383 Other fatigue: Secondary | ICD-10-CM | POA: Diagnosis not present

## 2023-08-14 DIAGNOSIS — E539 Vitamin B deficiency, unspecified: Secondary | ICD-10-CM | POA: Diagnosis not present

## 2023-08-14 DIAGNOSIS — E7800 Pure hypercholesterolemia, unspecified: Secondary | ICD-10-CM | POA: Diagnosis not present

## 2023-08-14 DIAGNOSIS — E7849 Other hyperlipidemia: Secondary | ICD-10-CM | POA: Diagnosis not present

## 2023-08-14 DIAGNOSIS — E782 Mixed hyperlipidemia: Secondary | ICD-10-CM | POA: Diagnosis not present

## 2023-10-10 DIAGNOSIS — E669 Obesity, unspecified: Secondary | ICD-10-CM | POA: Diagnosis not present

## 2023-10-10 DIAGNOSIS — M1712 Unilateral primary osteoarthritis, left knee: Secondary | ICD-10-CM | POA: Diagnosis not present

## 2023-10-10 DIAGNOSIS — S92001S Unspecified fracture of right calcaneus, sequela: Secondary | ICD-10-CM | POA: Diagnosis not present

## 2023-10-10 DIAGNOSIS — E539 Vitamin B deficiency, unspecified: Secondary | ICD-10-CM | POA: Diagnosis not present

## 2024-02-18 DIAGNOSIS — Z23 Encounter for immunization: Secondary | ICD-10-CM | POA: Diagnosis not present

## 2024-02-18 DIAGNOSIS — Z6839 Body mass index (BMI) 39.0-39.9, adult: Secondary | ICD-10-CM | POA: Diagnosis not present

## 2024-02-18 DIAGNOSIS — E539 Vitamin B deficiency, unspecified: Secondary | ICD-10-CM | POA: Diagnosis not present

## 2024-02-18 DIAGNOSIS — F32A Depression, unspecified: Secondary | ICD-10-CM | POA: Diagnosis not present

## 2024-02-18 DIAGNOSIS — R5383 Other fatigue: Secondary | ICD-10-CM | POA: Diagnosis not present
